# Patient Record
Sex: Male | Born: 1955 | Race: White | Hispanic: No | Marital: Married
Health system: Southern US, Community
[De-identification: ages and names within clinical notes are randomized; demographics above are authoritative.]

## PROBLEM LIST (undated history)

## (undated) DIAGNOSIS — E119 Type 2 diabetes mellitus without complications: Secondary | ICD-10-CM

---

## 2022-03-28 ENCOUNTER — Other Ambulatory Visit: Payer: Self-pay

## 2022-03-28 ENCOUNTER — Emergency Department: Payer: 59

## 2022-03-28 ENCOUNTER — Emergency Department
Admission: EM | Admit: 2022-03-28 | Discharge: 2022-03-28 | Disposition: A | Discharge status: Home / Self Care | Payer: 59 | Attending: Emergency Medicine | Admitting: Emergency Medicine

## 2022-03-28 DIAGNOSIS — S01511A Laceration without foreign body of lip, initial encounter: Secondary | ICD-10-CM | POA: Insufficient documentation

## 2022-03-28 DIAGNOSIS — S0181XA Laceration without foreign body of other part of head, initial encounter: Secondary | ICD-10-CM

## 2022-03-28 DIAGNOSIS — S0993XA Unspecified injury of face, initial encounter: Secondary | ICD-10-CM | POA: Diagnosis present

## 2022-03-28 DIAGNOSIS — Z23 Encounter for immunization: Secondary | ICD-10-CM | POA: Diagnosis not present

## 2022-03-28 DIAGNOSIS — S0121XA Laceration without foreign body of nose, initial encounter: Secondary | ICD-10-CM | POA: Insufficient documentation

## 2022-03-28 DIAGNOSIS — W541XXA Struck by dog, initial encounter: Secondary | ICD-10-CM | POA: Diagnosis not present

## 2022-03-28 MED ORDER — TETANUS-DIPHTH-ACELL PERTUSSIS 5-2.5-18.5 LF-MCG/0.5 IM SUSY
0.5000 mL | PREFILLED_SYRINGE | Freq: Once | INTRAMUSCULAR | Status: AC
  Administered 2022-03-28: 0.5 mL via INTRAMUSCULAR
  Filled 2022-03-28: qty 0.5

## 2022-03-28 MED ORDER — CEPHALEXIN 500 MG PO CAPS: 500.0000 mg | ORAL_CAPSULE | Freq: Three times a day (TID) | ORAL | 0 refills | Status: AC

## 2022-03-28 MED ORDER — LIDOCAINE-EPINEPHRINE-TETRACAINE (LET) TOPICAL GEL
3.0000 mL | Freq: Once | TOPICAL | Status: AC
  Administered 2022-03-28: 3 mL via TOPICAL
  Filled 2022-03-28: qty 3

## 2022-03-28 MED ORDER — LIDOCAINE-EPINEPHRINE (PF) 2 %-1:200000 IJ SOLN
20.0000 mL | Freq: Once | INTRAMUSCULAR | Status: AC
  Administered 2022-03-28: 20 mL
  Filled 2022-03-28: qty 20

## 2022-03-28 NOTE — ED Provider Notes (Signed)
[facility] Provider Note    Event Date/Time   First MD Initiated Contact with Patient 03/28/22 0830     (approximate)   History   Facial Pain   HPI  Matthew Welch is a 66 y.o. male   presents to the ED today with facial injury.  Patient states that his dog head butted him this morning causing a laceration to the edge of his nose on the right side and also a laceration to his upper lip.  Patient denies any loss of consciousness or dental injury.  He states that is been over 10 years since his last tetanus immunization.  Patient states this was not a dog bite but the head of his dog biting him in the face.  He states this is happened to his wife in the past also.        Physical Exam   Triage Vital Signs: ED Triage Vitals  Enc Vitals Group     BP 03/28/22 0824 136/85     Pulse Rate 03/28/22 0824 90     Resp 03/28/22 0824 18     Temp 03/28/22 0824 97.8 F (36.6 C)     Temp Source 03/28/22 0824 Oral     SpO2 03/28/22 0824 91 %     Weight 03/28/22 0818 241 lb (109.3 kg)     Height 03/28/22 0818 5\' 8"  (1.727 m)     Head Circumference --      Peak Flow --      Pain Score 03/28/22 0818 3     Pain Loc --      Pain Edu? --      Excl. in GC? --     Most recent vital signs: Vitals:   03/28/22 0824 03/28/22 1151  BP: 136/85 (!) 141/80  Pulse: 90 88  Resp: 18 16  Temp: 97.8 F (36.6 C) 98.1 F (36.7 C)  SpO2: 91% 93%     General: Awake, no distress.  Alert, talkative. CV:  Good peripheral perfusion.  Resp:  Normal effort.  Abd:  No distention.  Other:  There is a laceration to the upper lip without active bleeding.  Tenderness is noted on palpation of the nose and to the right naris there is a superficial laceration without active bleeding at this time.  No dental injuries noted.  No tenderness on palpation of cervical spine.   ED Results / Procedures / Treatments   Labs (all labs ordered are listed, but only abnormal results are  displayed) Labs Reviewed - No data to display    RADIOLOGY  Nasal bone x-ray images were reviewed and interpreted by me dependent of the radiologist and was negative for fracture.  Radiology official report is negative for fracture.   PROCEDURES:  Critical Care performed:   06/23/23Marland KitchenLaceration Repair  Date/Time: 03/28/2022 11:01 AM  Performed by: 03/30/2022, PA-C Authorized by: Tommi Rumps, PA-C   Consent:    Consent obtained:  Verbal   Consent given by:  Patient   Risks discussed:  Infection, poor wound healing and pain Laceration details:    Location:  Lip   Lip location:  Upper exterior lip   Length (cm):  1.5 Pre-procedure details:    Preparation:  Patient was prepped and draped in usual sterile fashion Exploration:    Limited defect created (wound extended): no     Hemostasis achieved with:  LET and direct pressure   Contaminated: no   Treatment:    Area  cleansed with:  Saline   Amount of cleaning:  Standard   Irrigation solution:  Sterile saline   Irrigation volume:  30   Irrigation method:  Tap Skin repair:    Repair method:  Sutures   Suture size:  6-0   Suture material:  Nylon   Suture technique:  Simple interrupted   Number of sutures:  4    MEDICATIONS ORDERED IN ED: Medications  Tdap (BOOSTRIX) injection 0.5 mL (0.5 mLs Intramuscular Given 03/28/22 0922)  lidocaine-EPINEPHrine-tetracaine (LET) topical gel (3 mLs Topical Given 03/28/22 1030)  lidocaine-EPINEPHrine (XYLOCAINE W/EPI) 2 %-1:200000 (PF) injection 20 mL (20 mLs Infiltration Given by Other 03/28/22 1112)     IMPRESSION / MDM / ASSESSMENT AND PLAN / ED COURSE  I reviewed the triage vital signs and the nursing notes.   Differential diagnosis includes, but is not limited to, nasal fracture, lip laceration, laceration right nares, dental injury.  66 year old male presents to the ED with complaint of facial laceration secondary to his dog biting him in the head this morning.  Patient  states it has been over 10 years since his last tetanus immunization.  A nasal bone x-ray failed to show any bony injury.  No dental injury noted.  Laceration is very superficial and no foreign body was noted.  This was repaired with 6-0 nylon without any difficulty and patient tolerated it well.  Dermabond was applied to the superficial lacerations to the right nares.  Patient was made aware that the sutures would need to be removed in approximately 5 to 6 days.  He is to watch for any signs of infection.  He was also placed on Keflex 500 mg since this was not a dog bite.  Tylenol or ibuprofen as needed for pain.       Patient's presentation is most consistent with acute complicated illness / injury requiring diagnostic workup.  FINAL CLINICAL IMPRESSION(S) / ED DIAGNOSES   Final diagnoses:  Laceration of multiple sites of face     Rx / DC Orders   ED Discharge Orders          Ordered    cephALEXin (KEFLEX) 500 MG capsule  3 times daily        03/28/22 1139             Note:  This document was prepared using Dragon voice recognition software and may include unintentional dictation errors.   Tommi Rumps, PA-C 03/28/22 1544    Arnaldo Natal, MD 03/28/22 [phone]

## 2022-03-28 NOTE — ED Triage Notes (Addendum)
Pt struck in the nose by dog, states did have nose bleed. None at present, no loc. Small lac noted to upper lip.

## 2022-03-28 NOTE — ED Notes (Signed)
Placed LET on upper lip and saline-moistened gauze to R nose.

## 2022-03-28 NOTE — Discharge Instructions (Addendum)
Follow-up with your primary care provider, urgent care or return to the emergency department for suture removal in approximate 5 to 6 days.  Keep area clean and dry.  Clean daily with mild soap and water and allowed to dry completely.  The area that got the Dermabond should flake off on its own like dead skin.  Begin taking antibiotic to prevent infection.  X-rays were negative for a nasal fracture.

## 2022-03-28 NOTE — ED Notes (Signed)
Pt to ED with wife, pt was interacting with his dog this morning and they accidentally butted heads. Pt has small laceration to front and bottom of upper lip (not to inner surface) and small laceration/split skin to R side of nose. No loose teeth. 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 Pt states he is also diabetic.

## 2022-12-11 ENCOUNTER — Encounter: Payer: Self-pay | Admitting: Internal Medicine

## 2022-12-12 ENCOUNTER — Encounter
Admission: RE | Disposition: A | Discharge status: Home / Self Care | Payer: Self-pay | Source: Ambulatory Visit | Attending: Internal Medicine

## 2022-12-12 ENCOUNTER — Ambulatory Visit: Payer: 59 | Admitting: Registered Nurse

## 2022-12-12 ENCOUNTER — Encounter: Payer: Self-pay | Admitting: Internal Medicine

## 2022-12-12 ENCOUNTER — Ambulatory Visit
Admission: RE | Admit: 2022-12-12 | Discharge: 2022-12-12 | Disposition: A | Discharge status: Home / Self Care | Payer: 59 | Source: Ambulatory Visit | Attending: Internal Medicine | Admitting: Internal Medicine

## 2022-12-12 DIAGNOSIS — K640 First degree hemorrhoids: Secondary | ICD-10-CM | POA: Diagnosis not present

## 2022-12-12 DIAGNOSIS — Z7984 Long term (current) use of oral hypoglycemic drugs: Secondary | ICD-10-CM | POA: Diagnosis not present

## 2022-12-12 DIAGNOSIS — Z1211 Encounter for screening for malignant neoplasm of colon: Secondary | ICD-10-CM | POA: Diagnosis present

## 2022-12-12 DIAGNOSIS — E109 Type 1 diabetes mellitus without complications: Secondary | ICD-10-CM | POA: Insufficient documentation

## 2022-12-12 DIAGNOSIS — Z6836 Body mass index (BMI) 36.0-36.9, adult: Secondary | ICD-10-CM | POA: Insufficient documentation

## 2022-12-12 HISTORY — PX: COLONOSCOPY: SHX5424

## 2022-12-12 HISTORY — DX: Type 2 diabetes mellitus without complications: E11.9

## 2022-12-12 LAB — GLUCOSE, CAPILLARY: Glucose-Capillary: 106 mg/dL — ABNORMAL HIGH (ref 70–99)

## 2022-12-12 SURGERY — COLONOSCOPY
Anesthesia: General

## 2022-12-12 MED ORDER — SODIUM CHLORIDE 0.9 % IV SOLN
INTRAVENOUS | Status: DC
  Administered 2022-12-12: 20 mL/h via INTRAVENOUS

## 2022-12-12 MED ORDER — PROPOFOL 10 MG/ML IV BOLUS
INTRAVENOUS | Status: DC | PRN
  Administered 2022-12-12: 70 mg via INTRAVENOUS

## 2022-12-12 MED ORDER — PROPOFOL 500 MG/50ML IV EMUL
INTRAVENOUS | Status: DC | PRN
  Administered 2022-12-12: 140 ug/kg/min via INTRAVENOUS

## 2022-12-12 NOTE — Anesthesia Preprocedure Evaluation (Addendum)
Anesthesia Evaluation  Patient identified by MRN, date of birth, ID band Patient awake    Reviewed: Allergy & Precautions, NPO status , Patient's Chart, lab work & pertinent test results  Airway Mallampati: II  TM Distance: >3 FB Neck ROM: Full    Dental  (+) Teeth Intact   Pulmonary neg pulmonary ROS   Pulmonary exam normal breath sounds clear to auscultation       Cardiovascular Exercise Tolerance: Good negative cardio ROS Normal cardiovascular exam Rhythm:Regular     Neuro/Psych negative neurological ROS  negative psych ROS   GI/Hepatic negative GI ROS, Neg liver ROS,,,  Endo/Other  negative endocrine ROSdiabetes, Well Controlled, Type 1, Insulin Dependent  Morbid obesity  Renal/GU negative Renal ROS  negative genitourinary   Musculoskeletal negative musculoskeletal ROS (+)    Abdominal  (+) + obese  Peds negative pediatric ROS (+)  Hematology negative hematology ROS (+)   Anesthesia Other Findings Past Medical History: No date: Diabetes mellitus without complication ([place])  History reviewed. No pertinent surgical history.  BMI    Body Mass Index: 36.58 kg/m      Reproductive/Obstetrics negative OB ROS                             Anesthesia Physical Anesthesia Plan  ASA: 3  Anesthesia Plan: General   Post-op Pain Management:    Induction: Intravenous  PONV Risk Score and Plan: Propofol infusion and TIVA  Airway Management Planned: Natural Airway and Nasal Cannula  Additional Equipment:   Intra-op Plan:   Post-operative Plan:   Informed Consent: I have reviewed the patients History and Physical, chart, labs and discussed the procedure including the risks, benefits and alternatives for the proposed anesthesia with the patient or authorized representative who has indicated his/her understanding and acceptance.     Dental Advisory Given  Plan Discussed with: CRNA  and Surgeon  Anesthesia Plan Comments:        Anesthesia Quick Evaluation

## 2022-12-12 NOTE — Op Note (Signed)
[facility] Patient Name: Matthew Welch Procedure Date: 12/12/2022 7:42 AM MRN: [id number] Account #: [id number] Date of Birth: 04-01-1956 Admit Type: Outpatient Age: 67 Room: [facility] ENDO ROOM 2 Gender: Male Note Status: Finalized Instrument Name: Matthew Welch [id number] Procedure:             Colonoscopy Indications:           Screening for colorectal malignant neoplasm Providers:             Matthew Apley K. Alice Reichert MD, MD Referring MD:          No Local Md, MD (Referring MD) Medicines:             Propofol per Anesthesia Complications:         No immediate complications. Procedure:             Pre-Anesthesia Assessment:                        - The risks and benefits of the procedure and the                         sedation options and risks were discussed with the                         patient. All questions were answered and informed                         consent was obtained.                        - Patient identification and proposed procedure were                         verified prior to the procedure by the nurse. The                         procedure was verified in the procedure room.                        - ASA Grade Assessment: III - A patient with severe                         systemic disease.                        - After reviewing the risks and benefits, the patient                         was deemed in satisfactory condition to undergo the                         procedure.                        After obtaining informed consent, the colonoscope was                         passed under direct vision. Throughout the procedure,  the patient's blood pressure, pulse, and oxygen                         saturations were monitored continuously. The                         Colonoscope was introduced through the anus and                         advanced to the the cecum, identified by appendiceal                          orifice and ileocecal valve. The colonoscopy was                         performed without difficulty. The patient tolerated                         the procedure well. The quality of the bowel                         preparation was good except the sigmoid colon was                         fair. The ileocecal valve, appendiceal orifice, and                         rectum were photographed. Findings:      The perianal and digital rectal examinations were normal. Pertinent       negatives include normal sphincter tone and no palpable rectal lesions.      Non-bleeding internal hemorrhoids were found during retroflexion. The       hemorrhoids were Grade I (internal hemorrhoids that do not prolapse).      The exam was otherwise without abnormality. Impression:            - Non-bleeding internal hemorrhoids.                        - The examination was otherwise normal.                        - No specimens collected. Recommendation:        - Patient has a contact number available for                         emergencies. The signs and symptoms of potential                         delayed complications were discussed with the patient.                         Return to normal activities tomorrow. Written                         discharge instructions were provided to the patient.                        - Resume previous diet.                        -  Continue present medications.                        - Repeat colonoscopy in 5 years for screening purposes.                        - Return to GI office PRN.                        - The findings and recommendations were discussed with                         the patient. Procedure Code(s):     --- Professional ---                        [id number], Colorectal cancer screening; colonoscopy on                         individual not meeting criteria for high risk Diagnosis Code(s):     --- Professional ---                        K64.0, First degree  hemorrhoids                        Z12.11, Encounter for screening for malignant neoplasm                         of colon CPT copyright 2022 [employer] Association. All rights reserved. The codes documented in this report are preliminary and upon coder review may  be revised to meet current compliance requirements. Matthew Sella MD, MD 12/12/2022 8:57:53 AM This report has been signed electronically. Number of Addenda: 0 Note Initiated On: 12/12/2022 7:42 AM Scope Withdrawal Time: 0 hours 6 minutes 38 seconds  Total Procedure Duration: 0 hours 10 minutes 30 seconds  Estimated Blood Loss:  Estimated blood loss: none.      [name]

## 2022-12-12 NOTE — Anesthesia Postprocedure Evaluation (Signed)
Anesthesia Post Note  Patient: Matthew Welch  Procedure(s) Performed: COLONOSCOPY  Patient location during evaluation: PACU Anesthesia Type: General Level of consciousness: awake Pain management: pain level controlled Vital Signs Assessment: post-procedure vital signs reviewed and stable Respiratory status: spontaneous breathing and nonlabored ventilation Cardiovascular status: stable Anesthetic complications: no   No notable events documented.   Last Vitals:  Vitals:   12/12/22 0906 12/12/22 0916  BP:  114/71  Pulse: 75 67  Resp:    Temp:    SpO2: 99% 99%    Last Pain:  Vitals:   12/12/22 0916  TempSrc:   PainSc: 0-No pain                 VAN STAVEREN,[place]

## 2022-12-12 NOTE — H&P (Signed)
Outpatient short stay form Pre-procedure 12/12/2022 8:37 AM Matthew Welch K. Alice Reichert, M.D.  Primary Physician: Barbaraann Boys, MD  Reason for visit:  Colon cancer screening  History of present illness:  Patient presents for colonoscopy for colon cancer screening. The patient denies complaints of abdominal pain, significant change in bowel habits, or rectal bleeding.      Current Facility-Administered Medications:    0.9 %  sodium chloride infusion, , Intravenous, Continuous, [place], Benay Pike, MD, Last Rate: 20 mL/hr at 12/12/22 0826, 20 mL/hr at 12/12/22 [id number]  Medications Prior to Admission  Medication Sig Dispense Refill Last Dose   glyBURIDE-metformin (GLUCOVANCE) 1.25-250 MG tablet Take 1 tablet by mouth daily with breakfast.   Past Week   cephALEXin (KEFLEX) 500 MG capsule Take 1 capsule (500 mg total) by mouth 3 (three) times daily. (Patient not taking: Reported on 12/12/2022) 21 capsule 0 Not Taking     Allergies  Allergen Reactions   Statins      Past Medical History:  Diagnosis Date   Diabetes mellitus without complication ([place])     Review of systems:  Otherwise negative.    Physical Exam  Gen: Alert, oriented. Appears stated age.  HEENT: [place]/AT. PERRLA. Lungs: CTA, no wheezes. CV: RR nl S1, S2. Abd: soft, benign, no masses. BS+ Ext: No edema. Pulses 2+    Planned procedures: Proceed with colonoscopy. The patient understands the nature of the planned procedure, indications, risks, alternatives and potential complications including but not limited to bleeding, infection, perforation, damage to internal organs and possible oversedation/side effects from anesthesia. The patient agrees and gives consent to proceed.  Please refer to procedure notes for findings, recommendations and patient disposition/instructions.     [name] K. Alice Reichert, M.D. Gastroenterology 12/12/2022  8:37 AM

## 2022-12-12 NOTE — Interval H&P Note (Signed)
History and Physical Interval Note:  12/12/2022 8:38 AM  Matthew Welch  has presented today for surgery, with the diagnosis of SCREENING.  The various methods of treatment have been discussed with the patient and family. After consideration of risks, benefits and other options for treatment, the patient has consented to  Procedure(s): COLONOSCOPY (N/A) as a surgical intervention.  The patient's history has been reviewed, patient examined, no change in status, stable for surgery.  I have reviewed the patient's chart and labs.  Questions were answered to the patient's satisfaction.     [place], [place]

## 2022-12-12 NOTE — Transfer of Care (Signed)
Immediate Anesthesia Transfer of Care Note  Patient: Matthew Welch  Procedure(s) Performed: COLONOSCOPY  Patient Location: PACU  Anesthesia Type:General  Level of Consciousness: awake, alert , and oriented  Airway & Oxygen Therapy: Patient Spontanous Breathing  Post-op Assessment: Report given to RN and Post -op Vital signs reviewed and stable  Post vital signs: Reviewed and stable  Last Vitals:  Vitals Value Taken Time  BP    Temp    Pulse 68 12/12/22 0857  Resp 16 12/12/22 0857  SpO2 91 % 12/12/22 0857  Vitals shown include unvalidated device data.  Last Pain:  Vitals:   12/12/22 0809  TempSrc: Temporal  PainSc: 0-No pain         Complications: No notable events documented.

## 2022-12-13 ENCOUNTER — Encounter: Payer: Self-pay | Admitting: Internal Medicine

## 2023-05-01 ENCOUNTER — Other Ambulatory Visit: Payer: Self-pay | Admitting: Pediatrics

## 2023-05-01 DIAGNOSIS — Z136 Encounter for screening for cardiovascular disorders: Secondary | ICD-10-CM

## 2023-05-01 DIAGNOSIS — I10 Essential (primary) hypertension: Secondary | ICD-10-CM

## 2023-05-09 ENCOUNTER — Ambulatory Visit
Admission: RE | Admit: 2023-05-09 | Discharge: 2023-05-09 | Disposition: A | Discharge status: Home / Self Care | Payer: 59 | Source: Ambulatory Visit | Attending: Pediatrics | Admitting: Pediatrics

## 2023-05-09 DIAGNOSIS — Z136 Encounter for screening for cardiovascular disorders: Secondary | ICD-10-CM | POA: Insufficient documentation

## 2023-05-09 DIAGNOSIS — I10 Essential (primary) hypertension: Secondary | ICD-10-CM | POA: Diagnosis present

## 2023-07-27 IMAGING — CR DG NASAL BONES 3+V
1 series · 3 of 3 positions shown · non-contrast
Comparison: None

CLINICAL DATA: Injury to nose, head-butted by dog

EXAM:
NASAL BONES - 3+ VIEW

[Series 1: dg nasal bones · 0.14mm/px · 3 of 3 slices shown]
[im 1/3]
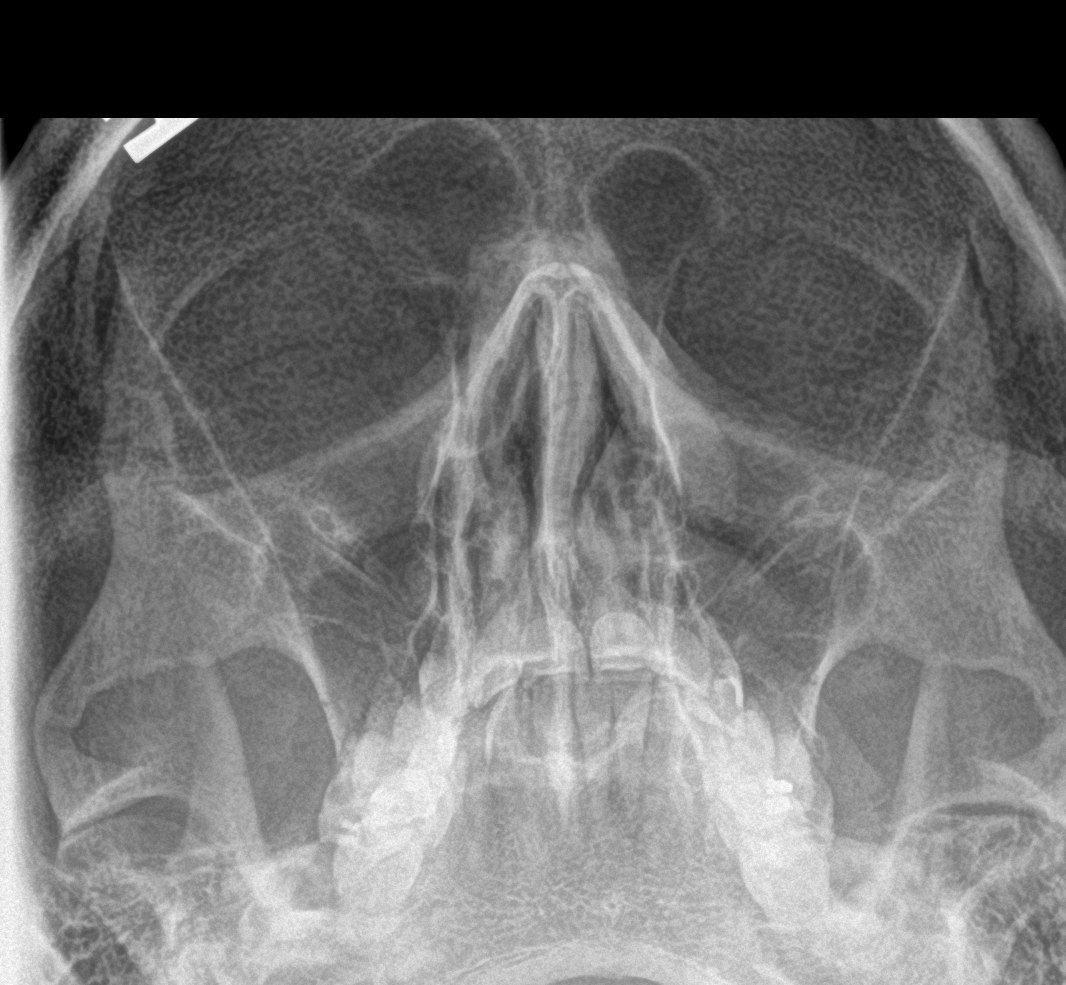
[im 2/3]
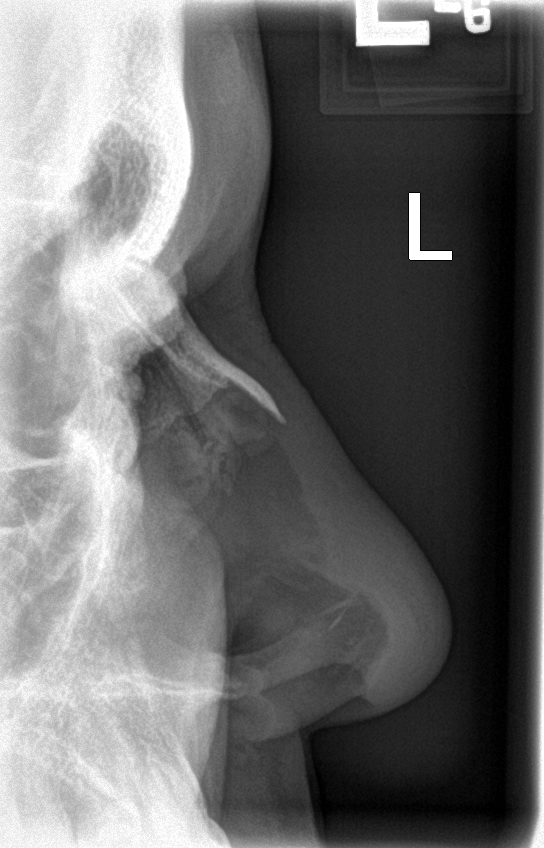
[im 3/3]
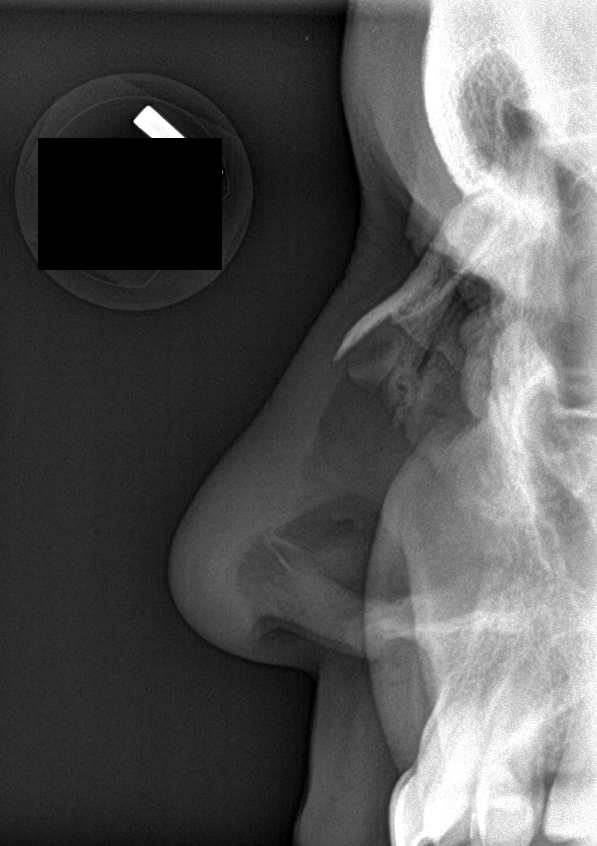

[3 of 3 positions shown; findings below may reference images not displayed]

FINDINGS: Nasal bones intact.

Nasal septum midline.

Paranasal sinuses clear.

Anterior maxillary spine intact
IMPRESSION: Normal exam.

## 2024-07-02 ENCOUNTER — Ambulatory Visit: Payer: Self-pay
